# Patient Record
Sex: Male | Born: 1965 | Race: White | Hispanic: No | Marital: Married | Smoking: Never smoker
Health system: Southern US, Community
[De-identification: ages and names within clinical notes are randomized; demographics above are authoritative.]

## PROBLEM LIST (undated history)

## (undated) DIAGNOSIS — C4490 Unspecified malignant neoplasm of skin, unspecified: Secondary | ICD-10-CM

## (undated) DIAGNOSIS — M542 Cervicalgia: Secondary | ICD-10-CM

## (undated) DIAGNOSIS — G43909 Migraine, unspecified, not intractable, without status migrainosus: Secondary | ICD-10-CM

## (undated) DIAGNOSIS — M26609 Unspecified temporomandibular joint disorder, unspecified side: Secondary | ICD-10-CM

## (undated) DIAGNOSIS — R51 Headache: Secondary | ICD-10-CM

## (undated) DIAGNOSIS — M4302 Spondylolysis, cervical region: Secondary | ICD-10-CM

## (undated) DIAGNOSIS — M1990 Unspecified osteoarthritis, unspecified site: Secondary | ICD-10-CM

## (undated) DIAGNOSIS — R519 Headache, unspecified: Secondary | ICD-10-CM

## (undated) DIAGNOSIS — F419 Anxiety disorder, unspecified: Secondary | ICD-10-CM

## (undated) HISTORY — DX: Migraine, unspecified, not intractable, without status migrainosus: G43.909

## (undated) HISTORY — DX: Cervicalgia: M54.2

## (undated) HISTORY — DX: Spondylolysis, cervical region: M43.02

## (undated) HISTORY — DX: Unspecified malignant neoplasm of skin, unspecified: C44.90

## (undated) HISTORY — DX: Headache: R51

## (undated) HISTORY — DX: Unspecified temporomandibular joint disorder, unspecified side: M26.609

## (undated) HISTORY — DX: Anxiety disorder, unspecified: F41.9

## (undated) HISTORY — DX: Unspecified osteoarthritis, unspecified site: M19.90

## (undated) HISTORY — DX: Headache, unspecified: R51.9

---
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
## 2003-08-30 HISTORY — PX: SKIN CANCER EXCISION: SHX779

## 2018-04-02 ENCOUNTER — Encounter: Payer: Self-pay | Admitting: *Deleted

## 2018-04-03 ENCOUNTER — Encounter: Payer: Self-pay | Admitting: Diagnostic Neuroimaging

## 2018-04-03 ENCOUNTER — Ambulatory Visit (INDEPENDENT_AMBULATORY_CARE_PROVIDER_SITE_OTHER): Payer: 59 | Admitting: Diagnostic Neuroimaging

## 2018-04-03 VITALS — BP 133/85 | HR 80 | Ht 71.5 in | Wt 212.4 lb

## 2018-04-03 DIAGNOSIS — G43109 Migraine with aura, not intractable, without status migrainosus: Secondary | ICD-10-CM

## 2018-04-03 NOTE — Progress Notes (Signed)
[facility]  PATIENT: Chase Bryan DOB: 11-16-1965  REFERRING CLINICIAN: S Drinkard, NP HISTORY FROM: patient  REASON FOR VISIT: new consult    HISTORICAL  CHIEF COMPLAINT:  Chief Complaint  Patient presents with  . Headache    rm 7, New Pt "trouble with headaches for years, taking Meloxicam instead of Maxalt- it helps"     HISTORY OF PRESENT ILLNESS:   52 year old male here for evaluation of headaches.  Patient has history of migraine headache since college.  He would average 1-2 headaches per semester.  Headaches fluctuated throughout his life.  Sometimes he would have 1-2 headaches per month.  He would have some visual aura with photophobia, throbbing sensation.  No nausea vomiting.  Lately headaches have been increasing.  He has noticed that stress, wine, lack of sleep seem to aggravate his headache.  Drinking water seems to help.  Patient now averaging 2-4 headaches per month.  Patient has tried Maxalt with some relief.  Patient also taking some meloxicam which helps a little bit.    REVIEW OF SYSTEMS: Full 14 system review of systems performed and negative with exception of: headache snoring ringing in ears.   ALLERGIES: Allergies  Allergen Reactions  . Sulfa Antibiotics Hives    rash    HOME MEDICATIONS: Outpatient Medications Prior to Visit  Medication Sig Dispense Refill  . cyclobenzaprine (FLEXERIL) 10 MG tablet Take 10 mg by mouth at bedtime. As needed    . Diclofenac Potassium (CATAFLAM PO) Take by mouth.    . meloxicam (MOBIC) 7.5 MG tablet Take 7.5 mg by mouth daily. Bid as needed    . rizatriptan (MAXALT) 10 MG tablet Take 10 mg by mouth as needed for migraine. May repeat in 2 hours if needed    . metaxalone (SKELAXIN) 800 MG tablet Take 800 mg by mouth 3 (three) times daily. As needed     No facility-administered medications prior to visit.     PAST MEDICAL HISTORY: Past Medical History:  Diagnosis Date  . Anxiety   .  Cervical spondylolysis   . Cervicalgia   . Headache    occipital  . Migraine   . Osteoarthritis   . Skin cancer   . TMJ (temporomandibular joint disorder)     PAST SURGICAL HISTORY: Past Surgical History:  Procedure Laterality Date  . SKIN CANCER EXCISION  2005   melanoma, face  . TONSILLECTOMY  1977    FAMILY HISTORY: Family History  Problem Relation Age of Onset  . Stroke Father   . Arthritis Father   . Skin cancer Father   . Prostate cancer Father   . Hypertension Father   . Hyperlipidemia Father   . Dementia Father        Lewy body  . Parkinson's disease Father   . Cancer Maternal Grandmother        stomach  . Cancer Maternal Grandfather        bladder and stomach  . Heart disease Paternal Grandfather     SOCIAL HISTORY: Social History   Socioeconomic History  . Marital status: Married    Spouse name: Linus Orn  . Number of children: 2  . Years of education: Masters  . Highest education level: Not on file  Occupational History    Comment: [employer]  Social Needs  . Financial resource strain: Not on file  . Food insecurity:    Worry: Not on file    Inability: Not on file  . Transportation needs:  Medical: Not on file    Non-medical: Not on file  Tobacco Use  . Smoking status: Never Smoker  . Smokeless tobacco: Never Used  Substance and Sexual Activity  . Alcohol use: Yes    Comment: 04/03/18 2 beers/wk  . Drug use: Never  . Sexual activity: Not on file  Lifestyle  . Physical activity:    Days per week: Not on file    Minutes per session: Not on file  . Stress: Not on file  Relationships  . Social connections:    Talks on phone: Not on file    Gets together: Not on file    Attends religious service: Not on file    Active member of club or organization: Not on file    Attends meetings of clubs or organizations: Not on file    Relationship status: Not on file  . Intimate partner violence:    Fear of current or ex partner: Not on file     Emotionally abused: Not on file    Physically abused: Not on file    Forced sexual activity: Not on file  Other Topics Concern  . Not on file  Social History Narrative   Lives with wife   Caffeine- coffee, 3 cups daily     PHYSICAL EXAM  GENERAL EXAM/CONSTITUTIONAL: Vitals:  Vitals:   04/03/18 1425  BP: 133/85  Pulse: 80  Weight: 212 lb 6.4 oz (96.3 kg)  Height: 5' 11.5" (1.816 m)     Body mass index is 29.21 kg/m. Wt Readings from Last 3 Encounters:  04/03/18 212 lb 6.4 oz (96.3 kg)     Patient is in no distress; well developed, nourished and groomed; neck is supple  CARDIOVASCULAR:  Examination of carotid arteries is normal; no carotid bruits  Regular rate and rhythm, no murmurs  Examination of peripheral vascular system by observation and palpation is normal  EYES:  Ophthalmoscopic exam of optic discs and posterior segments is normal; no papilledema or hemorrhages  Visual Acuity Screening   Right eye Left eye Both eyes  Without correction:     With correction: 20/30 20/30      MUSCULOSKELETAL:  Gait, strength, tone, movements noted in Neurologic exam below  NEUROLOGIC: MENTAL STATUS:  No flowsheet data found.  awake, alert, oriented to person, place and time  recent and remote memory intact  normal attention and concentration  language fluent, comprehension intact, naming intact  fund of knowledge appropriate  CRANIAL NERVE:   2nd - no papilledema on fundoscopic exam  2nd, 3rd, 4th, 6th - pupils equal and reactive to light, visual fields full to confrontation, extraocular muscles intact, no nystagmus  5th - facial sensation symmetric  7th - facial strength symmetric  8th - hearing intact  9th - palate elevates symmetrically, uvula midline  11th - shoulder shrug symmetric  12th - tongue protrusion midline  MOTOR:   normal bulk and tone, full strength in the BUE, BLE  SENSORY:   normal and symmetric to light touch,  temperature, vibration  COORDINATION:   finger-nose-finger, fine finger movements normal  REFLEXES:   deep tendon reflexes present and symmetric  GAIT/STATION:   narrow based gait; able to walk tandem; romberg is negative     DIAGNOSTIC DATA (LABS, IMAGING, TESTING) - I reviewed patient records, labs, notes, testing and imaging myself where available.  No results found for: WBC, HGB, HCT, MCV, PLT No results found for: NA, K, CL, CO2, GLUCOSE, BUN, CREATININE, CALCIUM, PROT, ALBUMIN, AST,  ALT, ALKPHOS, BILITOT, GFRNONAA, GFRAA No results found for: CHOL, HDL, LDLCALC, LDLDIRECT, TRIG, CHOLHDL No results found for: HGBA1C No results found for: VITAMINB12 No results found for: TSH   06/19/17 CBC, CMP from PCP reviewed  --> normal   ASSESSMENT AND PLAN  52 y.o. year old male here with migraine with aura since college, with headaches averaging 2 to 4 / month.   Dx:  1. Migraine with aura and without status migrainosus, not intractable     PLAN:  MIGRAINE WITH AURA - continue maxalt as needed - may consider propranolol in future if HA increase - may consider MRI in future if HA increase / change  SLEEP HYGIENE / SNORING - review [email or web address] sleep hygiene suggestions - consider sleep study   Return in about 6 months (around 10/04/2018).    [name], MD 10/07/5282, 1:32 PM Certified in Neurology, Neurophysiology and Neuroimaging  [facility] [address], [place],  [address] [phone]

## 2018-04-03 NOTE — Patient Instructions (Signed)
MIGRAINE WITH AURA - continue maxalt as needed - may consider propranolol in future if HA increase - may consider MRI in future if HA increase / change  SLEEP HYGIENE / SNORING - review [email or web address] - consider sleep study     Thank you for coming to see [place] at [facility]. I hope we have been able to provide you high quality care today.  You may receive a patient satisfaction survey over the next few weeks. We would appreciate your feedback and comments so that we may continue to improve ourselves and the health of our patients.     ~~~~~~~~~~~~~~~~~~~~~~~~~~~~~~~~~~~~~~~~~~~~~~~~~~~~~~~~~~~~~~~~~  DR. Kevona Lupinacci'S GUIDE TO HAPPY AND HEALTHY LIVING These are some of my general health and wellness recommendations. Some of them may apply to you better than others. Please use common sense as you try these suggestions and feel free to ask me any questions.   ACTIVITY/FITNESS Mental, social, emotional and physical stimulation are very important for brain and body health. Try learning a new activity (arts, music, language, sports, games).  Keep moving your body to the best of your abilities. You can do this at home, inside or outside, the park, community center, gym or anywhere you like. Consider a physical therapist or personal trainer to get started. Fitness trackers, smart-watches or  smart-phones can help as well.   NUTRITION Eat more plants: colorful vegetables, nuts, seeds and berries.  Eat less sugar, salt, preservatives and processed foods.  Avoid toxins such as cigarettes and alcohol.  Drink water when you are thirsty. Warm water with a slice of lemon is an excellent morning drink to start the day.  Consider these websites for more information The Nutrition Source (https://www.henry-hernandez.biz/) Precision Nutrition (WindowBlog.ch)   RELAXATION Consider practicing mindfulness meditation or other relaxation  techniques such as deep breathing, prayer, yoga, tai chi, massage. See website mindful.org or the apps Headspace or Calm to help get started.   SLEEP Try to get at least 7-8+ hours sleep per day. Regular exercise and reduced caffeine will help you sleep better. Practice good sleep hygeine techniques. See website sleep.org for more information.   PLANNING Prepare estate planning, living will, healthcare POA documents. Sometimes this is best planned with the help of an attorney. Theconversationproject.org and agingwithdignity.org are excellent resources.

## 2018-04-06 ENCOUNTER — Encounter: Payer: Self-pay | Admitting: Diagnostic Neuroimaging

## 2018-07-09 ENCOUNTER — Other Ambulatory Visit: Payer: Self-pay | Admitting: Internal Medicine

## 2018-07-11 ENCOUNTER — Other Ambulatory Visit: Payer: Self-pay | Admitting: Internal Medicine

## 2018-07-11 DIAGNOSIS — Z83438 Family history of other disorder of lipoprotein metabolism and other lipidemia: Secondary | ICD-10-CM

## 2018-07-16 ENCOUNTER — Ambulatory Visit
Admission: RE | Admit: 2018-07-16 | Discharge: 2018-07-16 | Disposition: A | Discharge status: Home / Self Care | Payer: No Typology Code available for payment source | Source: Ambulatory Visit | Attending: Internal Medicine | Admitting: Internal Medicine

## 2018-07-16 DIAGNOSIS — Z83438 Family history of other disorder of lipoprotein metabolism and other lipidemia: Secondary | ICD-10-CM

## 2018-10-15 ENCOUNTER — Ambulatory Visit: Payer: 59 | Admitting: Diagnostic Neuroimaging

## 2018-11-12 ENCOUNTER — Other Ambulatory Visit: Payer: Self-pay | Admitting: Internal Medicine

## 2018-11-12 DIAGNOSIS — R109 Unspecified abdominal pain: Secondary | ICD-10-CM

## 2018-12-03 ENCOUNTER — Other Ambulatory Visit: Payer: No Typology Code available for payment source

## 2018-12-26 ENCOUNTER — Telehealth: Payer: Self-pay | Admitting: *Deleted

## 2018-12-26 NOTE — Telephone Encounter (Signed)
Spoke with patient and offered sooner FU; he was to be seen in March but was in [place], unable to get home due to Covid 19. I advised him due to current COVID 19 pandemic, our office is severely reducing in person visits in order to minimize the risk to our patients and healthcare providers. We recommend to convert your appointment to a video visit. We'll take all precautions to reduce any security or privacy concerns. This will be treated like an office visit, and we will file with your insurance.  He consented to video visit. Pt's email is [email or web address]. Pt understands that he'll receive e mail with instructions. Moved  his FU, updated EMR. He  verbalized understanding, appreciation. E mail sent.

## 2018-12-31 ENCOUNTER — Ambulatory Visit (INDEPENDENT_AMBULATORY_CARE_PROVIDER_SITE_OTHER): Payer: 59 | Admitting: Diagnostic Neuroimaging

## 2018-12-31 ENCOUNTER — Encounter

## 2018-12-31 ENCOUNTER — Other Ambulatory Visit: Payer: Self-pay

## 2018-12-31 DIAGNOSIS — G43109 Migraine with aura, not intractable, without status migrainosus: Secondary | ICD-10-CM

## 2018-12-31 MED ORDER — RIZATRIPTAN BENZOATE 10 MG PO TABS: 10.0000 mg | ORAL_TABLET | ORAL | 6 refills | Status: DC | PRN

## 2018-12-31 NOTE — Progress Notes (Signed)
    Virtual Visit via Video Note  I connected with Chase Bryan on 12/31/18 at  3:30 PM EDT by a video enabled telemedicine application and verified that I am speaking with the correct person using two identifiers.   I discussed the limitations of evaluation and management by telemedicine and the availability of in person appointments. The patient expressed understanding and agreed to proceed.  Patient is at their office. I am at my office.    History of Present Illness:  - HA are improved; patient has improved his nutrition and exercise.  And also sleep habits have improved. - Patient averaging about 1 headache per month.  During that headache he is usually able to control this with 1.5 tabs of Maxalt over 24 hours. -Patient is currently working in [facility] on a job assignment since March 2020; plan to return in a few weeks to [place].    Observations/Objective:  VIDEO EXAM  GENERAL EXAM/CONSTITUTIONAL:  Vitals: There were no vitals filed for this visit.  There is no height or weight on file to calculate BMI. Wt Readings from Last 3 Encounters:  04/03/18 212 lb 6.4 oz (96.3 kg)     Patient is in no distress; well developed, nourished and groomed; neck is supple   NEUROLOGIC: MENTAL STATUS:  No flowsheet data found.  awake, alert, oriented to person, place and time  recent and remote memory intact  normal attention and concentration  language fluent, comprehension intact, naming intact  fund of knowledge appropriate  CRANIAL NERVE:   2nd, 3rd, 4th, 6th - visual fields full to confrontation, extraocular muscles intact, no nystagmus  5th - facial sensation symmetric  7th - facial strength symmetric  8th - hearing intact  11th - shoulder shrug symmetric  12th - tongue protrusion midline  MOTOR:   NO TREMOR; NO DRIFT IN BUE  SENSORY:   normal and symmetric to light touch  COORDINATION:   fine finger movements normal    Assessment and  Plan:  53 y.o. male with:  MIGRAINE WITH AURA - continue maxalt as needed - continue to improved nutrition and exercise habits - may consider propranolol in future if HA increase - may consider MRI in future if HA increase / change  Meds ordered this encounter  Medications  . rizatriptan (MAXALT) 10 MG tablet    Sig: Take 1 tablet (10 mg total) by mouth as needed for migraine. May repeat in 2 hours if needed    Dispense:  10 tablet    Refill:  6   SLEEP HYGIENE / SNORING - continue sleep.[email or web address] sleep hygiene suggestions   Follow Up Instructions:  - Return in about 1 year (around 12/31/2019).     I discussed the assessment and treatment plan with the patient. The patient was provided an opportunity to ask questions and all were answered. The patient agreed with the plan and demonstrated an understanding of the instructions.   The patient was advised to call back or seek an in-person evaluation if the symptoms worsen or if the condition fails to improve as anticipated.  I provided 15 minutes of non-face-to-face time during this encounter.    [name], MD 10/03/5807, 9:83 PM Certified in Neurology, Neurophysiology and Neuroimaging  [facility] [address], [place], [place] [address] [phone]

## 2019-01-28 ENCOUNTER — Ambulatory Visit
Admission: RE | Admit: 2019-01-28 | Discharge: 2019-01-28 | Disposition: A | Discharge status: Home / Self Care | Payer: 59 | Source: Ambulatory Visit | Attending: Internal Medicine | Admitting: Internal Medicine

## 2019-01-28 ENCOUNTER — Telehealth: Payer: Self-pay | Admitting: Diagnostic Neuroimaging

## 2019-01-28 ENCOUNTER — Other Ambulatory Visit: Payer: Self-pay

## 2019-01-28 ENCOUNTER — Ambulatory Visit: Payer: 59 | Admitting: Diagnostic Neuroimaging

## 2019-01-28 DIAGNOSIS — R109 Unspecified abdominal pain: Secondary | ICD-10-CM

## 2019-01-28 NOTE — Telephone Encounter (Signed)
Called patient and advised him Dr Leta Baptist has never prescribed flexeril for him. He stated they discussed it in the video visit. He has gotten in the past from neurologist in [place] and PCP, however he no longer has active Rx. Othrewise he only takes rizatriptan as needed and melatonin as needed. He no longer takes meloxicam, cataflam or skelaxin. I advised will let Dr Leta Baptist know of his request. He verbalized understanding, appreciation.

## 2019-01-28 NOTE — Telephone Encounter (Signed)
Pt called in for a refill of cyclobenzaprine (FLEXERIL) 10 MG tablet to be sent to [facility] [place], [place] - [address]

## 2019-01-28 NOTE — Telephone Encounter (Signed)
Called patient and advised Chase Bryan that Dr Leta Baptist will give #30 tabs of flexeril, however he doesn't recommend it long term for headaches. The patient would like #30 tab prescribed,  verbalized understanding, appreciation.

## 2019-01-29 MED ORDER — CYCLOBENZAPRINE HCL 10 MG PO TABS: 10.0000 mg | ORAL_TABLET | Freq: Every day | ORAL | 0 refills | Status: DC

## 2019-07-19 ENCOUNTER — Other Ambulatory Visit: Payer: Self-pay | Admitting: Diagnostic Neuroimaging

## 2019-10-28 ENCOUNTER — Ambulatory Visit: Payer: 59 | Attending: Internal Medicine

## 2019-10-28 DIAGNOSIS — Z23 Encounter for immunization: Secondary | ICD-10-CM | POA: Insufficient documentation

## 2019-10-28 NOTE — Progress Notes (Signed)
   Covid-19 Vaccination Clinic  Name:  Chase Bryan    MRN: [id number] DOB: 1966-04-17  10/28/2019  [name] was observed post Covid-19 immunization for 15 minutes without incidence. He was provided with Vaccine Information Sheet and instruction to access the V-Safe system.   [name] was instructed to call 911 with any severe reactions post vaccine: Marland Kitchen Difficulty breathing  . Swelling of your face and throat  . A fast heartbeat  . A bad rash all over your body  . Dizziness and weakness    Immunizations Administered    Name Date Dose VIS Date Route   Pfizer COVID-19 Vaccine 10/28/2019  8:37 AM 0.3 mL 08/09/2019 Intramuscular   Manufacturer: [place]   Lot: [id number]   [place]: [id number]

## 2019-10-31 ENCOUNTER — Ambulatory Visit: Payer: 59

## 2019-11-26 ENCOUNTER — Ambulatory Visit: Payer: 59

## 2020-01-13 ENCOUNTER — Other Ambulatory Visit: Payer: Self-pay | Admitting: Diagnostic Neuroimaging

## 2020-02-18 ENCOUNTER — Telehealth: Payer: Self-pay | Admitting: *Deleted

## 2020-02-18 ENCOUNTER — Other Ambulatory Visit: Payer: Self-pay | Admitting: Diagnostic Neuroimaging

## 2020-02-18 MED ORDER — RIZATRIPTAN BENZOATE 10 MG PO TABS: 10.0000 mg | ORAL_TABLET | ORAL | 0 refills | Status: DC | PRN

## 2020-02-18 NOTE — Telephone Encounter (Signed)
Pt called today and LVM at 3:10 pm asking for one more refill of Rizatriptan. He is getting ready to go out of town and then will schedule a follow-up appointment. He is willing to do a video visit. Call back # [phone].

## 2020-02-18 NOTE — Addendum Note (Signed)
Addended by: Minna Antis on: 02/18/2020 04:32 PM   Modules accepted: Orders

## 2020-02-18 NOTE — Telephone Encounter (Signed)
Gave 1 month refill on rizatriptan with note to pharmacy: no more refills until he comes for FU.

## 2020-04-20 ENCOUNTER — Telehealth: Payer: Self-pay

## 2020-04-20 NOTE — Telephone Encounter (Signed)
Pt left a VM asking for an appt with Dr. Leta Baptist.

## 2020-04-20 NOTE — Telephone Encounter (Signed)
Called patient and scheduled my chart VV. I sent him link to sign up for my chart via his e mail. Patient verbalized understanding, appreciation.

## 2020-04-21 ENCOUNTER — Encounter: Payer: Self-pay | Admitting: Diagnostic Neuroimaging

## 2020-04-21 ENCOUNTER — Telehealth (INDEPENDENT_AMBULATORY_CARE_PROVIDER_SITE_OTHER): Payer: 59 | Admitting: Diagnostic Neuroimaging

## 2020-04-21 DIAGNOSIS — G43109 Migraine with aura, not intractable, without status migrainosus: Secondary | ICD-10-CM

## 2020-04-21 MED ORDER — RIZATRIPTAN BENZOATE 10 MG PO TBDP: 10.0000 mg | ORAL_TABLET | ORAL | 11 refills | Status: AC | PRN

## 2020-04-21 NOTE — Progress Notes (Signed)
    Virtual Visit via Video Note  I connected with Chase Bryan on 04/21/20 at  3:30 PM EDT by a video enabled telemedicine application and verified that I am speaking with the correct person using two identifiers.   I discussed the limitations of evaluation and management by telemedicine and the availability of in person appointments. The patient expressed understanding and agreed to proceed.  Patient is at their office in [place]. I am at my office in [place].   History of Present Illness:  UPDATE (04/21/20, VRP): Since last visit, doing well.  Patient has 1 migraine every other week, well controlled with Maxalt.  Patient has improved his sleep hygiene, exercise and stress management.  He is significantly cut out alcohol.  Headaches seem to be much better controlled with these lifestyle changes.  Patient still working in [place] 4 days out of the week and returns to [place] weekly.     Observations/Objective:  -video visit   Assessment and Plan:  54 y.o. male with:  [place] - continue maxalt as needed - continue regular exercise, sleep hygiene, nutrition - limit caffeine and alcohol - may consider propranolol in future if headaches increase  Meds ordered this encounter  Medications  . rizatriptan (MAXALT-MLT) 10 MG disintegrating tablet    Sig: Take 1 tablet (10 mg total) by mouth as needed for migraine. May repeat in 2 hours if needed    Dispense:  9 tablet    Refill:  11    Follow Up Instructions:  - Return in about 1 year (around 04/21/2021), or video visit.   I discussed the assessment and treatment plan with the patient. The patient was provided an opportunity to ask questions and all were answered. The patient agreed with the plan and demonstrated an understanding of the instructions.   The patient was advised to call back or seek an in-person evaluation if the symptoms worsen or if the condition fails to improve as anticipated.  I provided 15  minutes of non-face-to-face time during this encounter.    [name], MD 0/17/5102, 5:85 PM Certified in Neurology, Neurophysiology and Neuroimaging  [facility] [address] [place], [place] [address] [phone]

## 2021-05-26 IMAGING — CT CT ABDOMEN AND PELVIS WITHOUT CONTRAST
1 of 2 series · 13 of 32 positions shown, 18 images · non-contrast
Comparison: None.

CLINICAL DATA: Left flank region pain.  History of melanoma

EXAM:
CT ABDOMEN AND PELVIS WITHOUT CONTRAST
TECHNIQUE: Multidetector CT imaging of the abdomen and pelvis was performed
following the standard protocol without oral or IV contrast.

[Series 2: renal standard/full · axial · 0.75mm/px · z∈[-460,-10]mm · 13 of 100 slices shown, 18 images]
[im 5/100  soft-tissue]
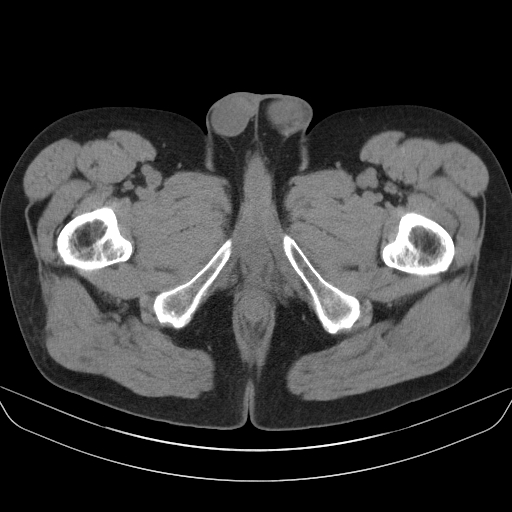
[im 5/100  bone]
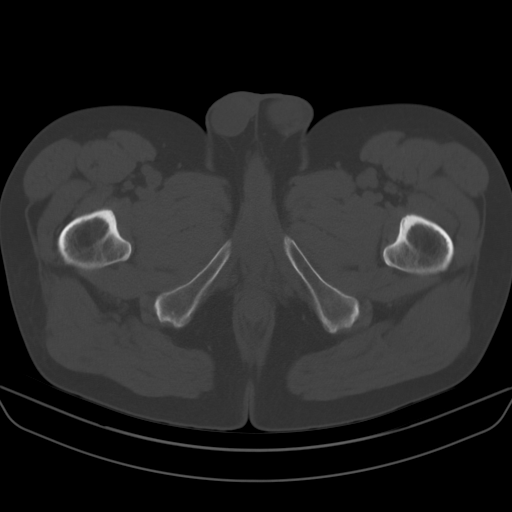
[im 15/100  soft-tissue]
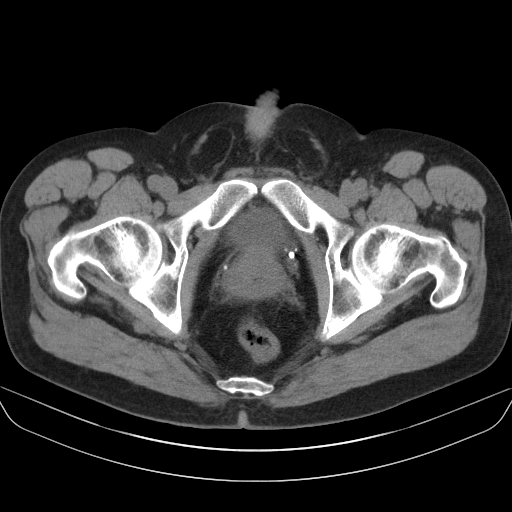
[im 20/100  soft-tissue]
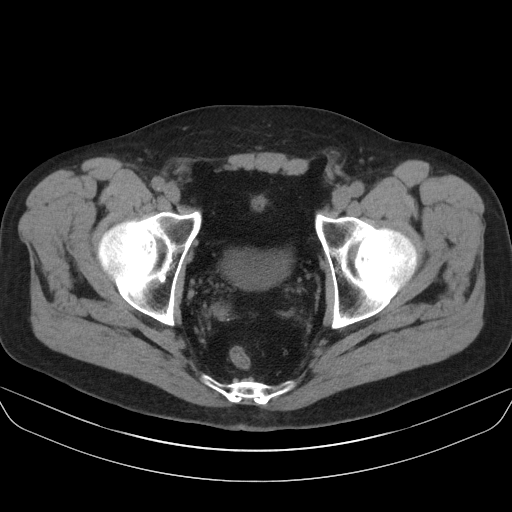
[im 30/100  soft-tissue]
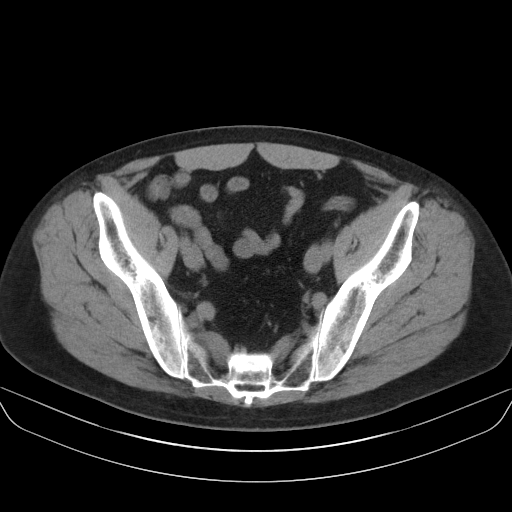
[im 40/100  soft-tissue]
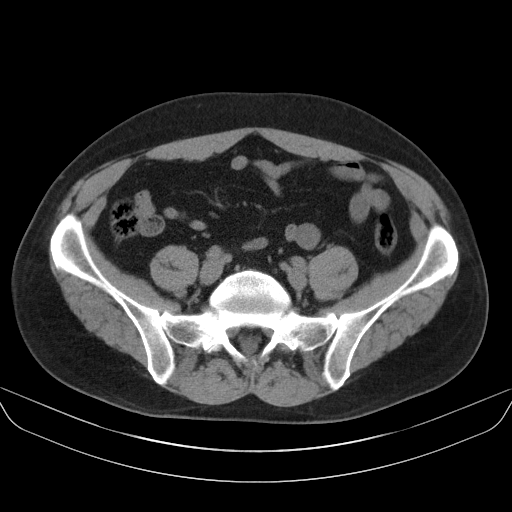
[im 45/100  soft-tissue]
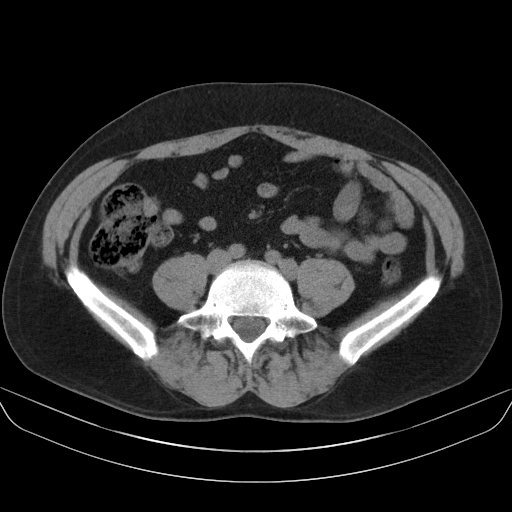
[im 55/100  soft-tissue]
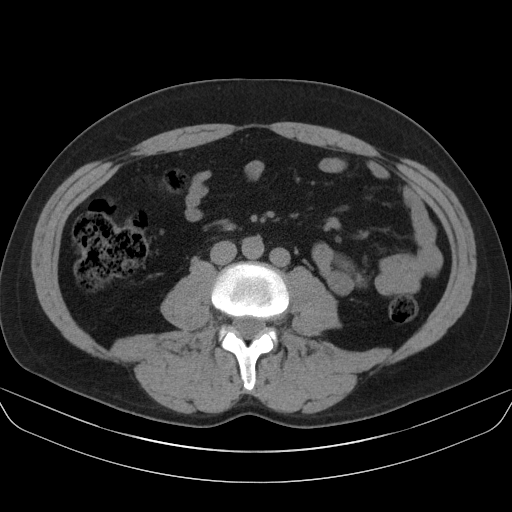
[im 60/100  soft-tissue]
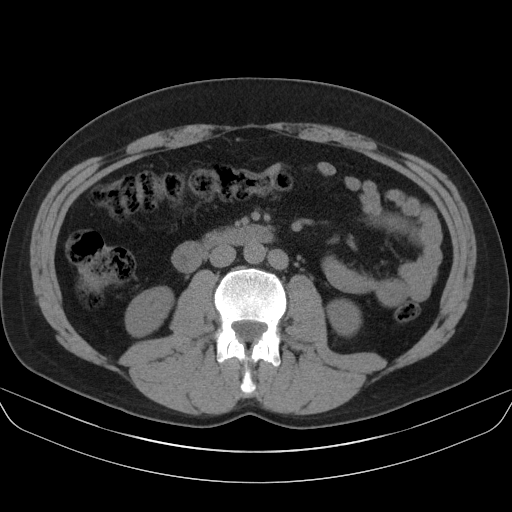
[im 70/100  soft-tissue]
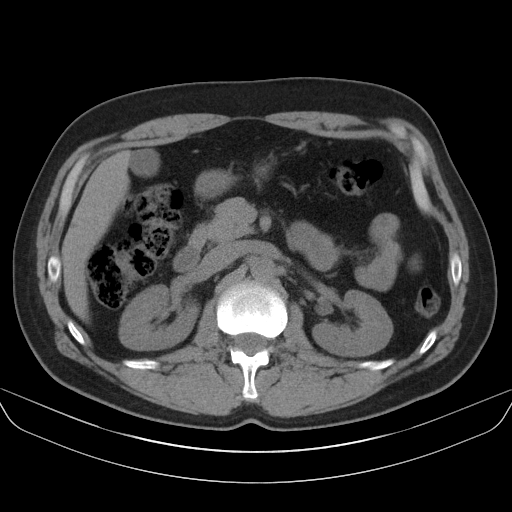
[im 70/100  bone]
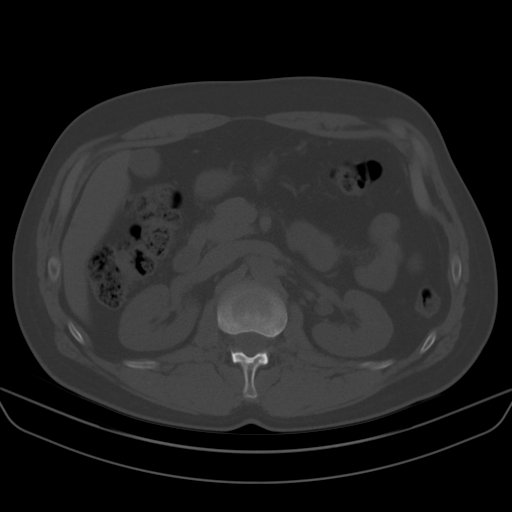
[im 80/100  soft-tissue]
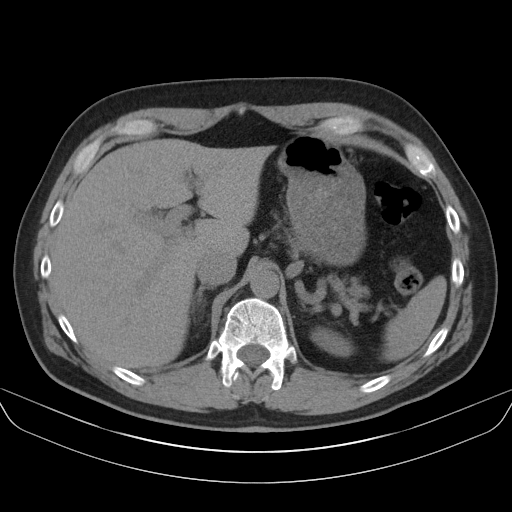
[im 80/100  lung]
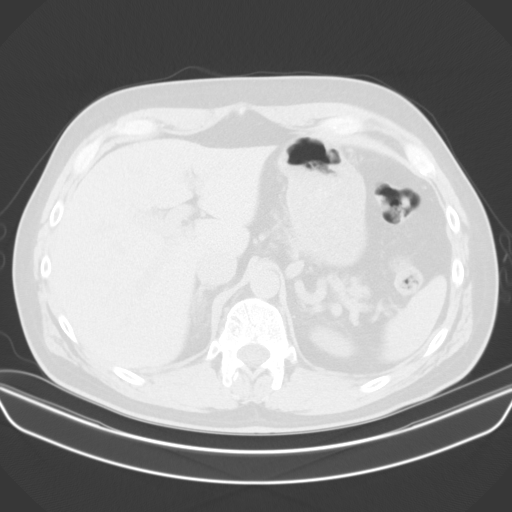
[im 85/100  soft-tissue]
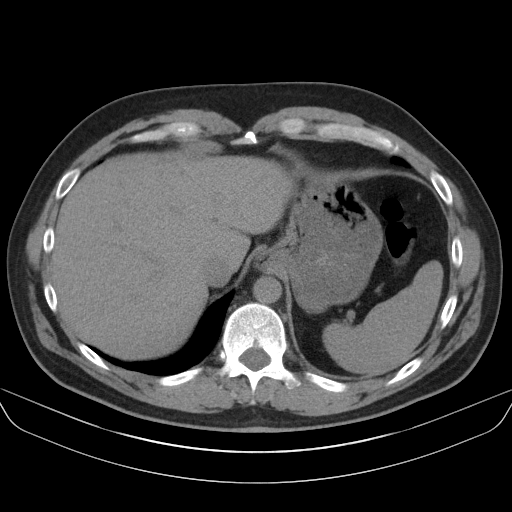
[im 85/100  lung]
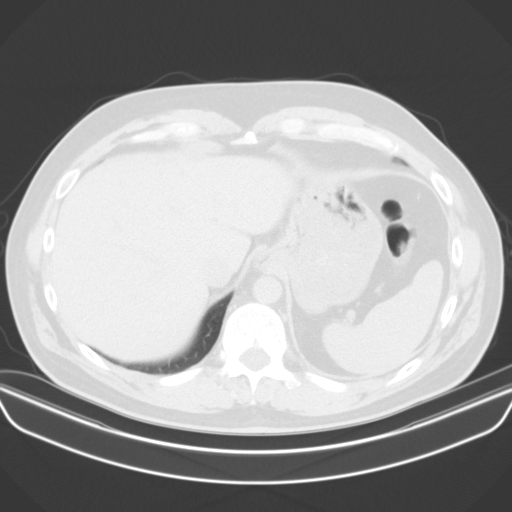
[im 90/100  lung]
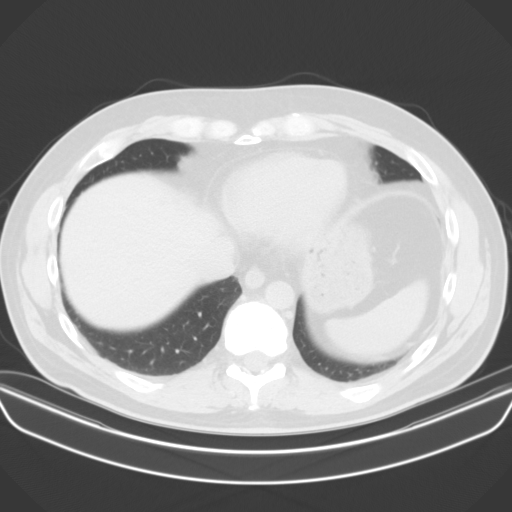
[im 95/100  soft-tissue]
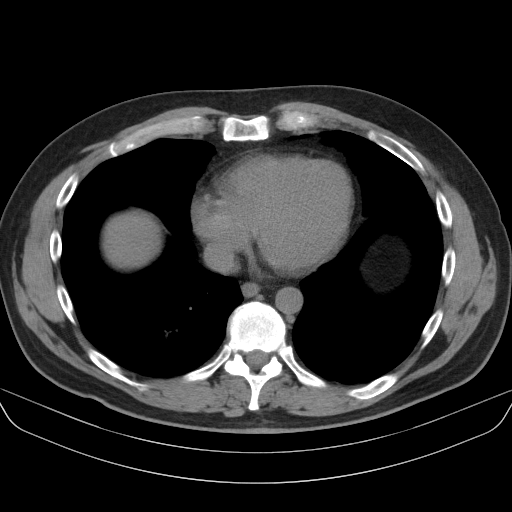
[im 95/100  lung]
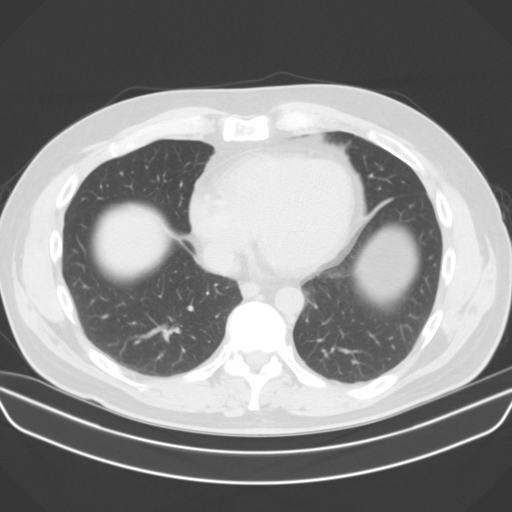

[13 of 32 positions shown; findings below may reference images not displayed]

FINDINGS: Lower chest: Lung bases are clear.

Hepatobiliary: They are are scattered subcentimeter probable cysts
involving several hepatic segments. Beyond these apparent
subcentimeter cysts, no focal liver lesions are evident. Gallbladder
wall is not appreciably thickened. There is no biliary duct
dilatation.

Pancreas: There is no pancreatic mass or inflammatory focus.

Spleen: No splenic lesions are evident.

Adrenals/Urinary Tract: Adrenals bilaterally appear unremarkable.
Kidneys bilaterally show no evident mass or hydronephrosis on either
side. There is a 1 mm calculus in the mid right kidney. There is no
evident ureteral calculus on either side. Urinary bladder is midline
with wall thickness within normal limits.

Stomach/Bowel: There is no appreciable bowel wall or mesenteric
thickening. There is no demonstrable bowel obstruction. Terminal
ileum appears unremarkable. There is no evident free air or portal
venous air.

Vascular/Lymphatic: There is mild calcification in the aorta. No
aneurysm evident. Only minimal pelvic arterial vascular
calcification is evident. No adenopathy is evident in the abdomen or
pelvis.

Reproductive: Prostate and seminal vesicles are normal in size and
contour. No evident pelvic mass.

Other: Appendix appears normal. No abscess or ascites is evident in
the abdomen or pelvis. There is fat in each inguinal ring. There is
a minimal ventral hernia containing only fat.

Musculoskeletal: No blastic or lytic bone lesions are evident. No
intramuscular lesions are appreciable.
IMPRESSION: 1. 1 mm calculus mid right kidney, nonobstructing. No left-sided
renal calculi. No ureteral calculus or hydronephrosis on either
side. Urinary bladder wall thickness is within normal limits.

2. Subcentimeter presumed cysts in the liver. Given history of
melanoma, if further assessment of the liver is felt to be
warranted, would advise either pre and serial post-contrast MR or CT
to further assess.

3. No evident bowel obstruction. No abscess in the abdomen or
pelvis. Appendix appears normal.

4. Moderate fat in each inguinal ring. No bowel extends into these
inguinal hernias. Minimal ventral hernia containing only fat noted.
